# Patient Record
Sex: Male | Born: 1990 | Race: Black or African American | Hispanic: No | Marital: Single | Smoking: Never smoker
Health system: Southern US, Community
[De-identification: ages and names within clinical notes are randomized; demographics above are authoritative.]

## PROBLEM LIST (undated history)

## (undated) DIAGNOSIS — Z789 Other specified health status: Secondary | ICD-10-CM

---

## 2019-11-15 ENCOUNTER — Emergency Department (HOSPITAL_COMMUNITY)
Admission: EM | Admit: 2019-11-15 | Discharge: 2019-11-15 | Disposition: A | Discharge status: Home / Self Care | Payer: 59 | Attending: Emergency Medicine | Admitting: Emergency Medicine

## 2019-11-15 ENCOUNTER — Emergency Department (HOSPITAL_COMMUNITY): Payer: 59

## 2019-11-15 ENCOUNTER — Other Ambulatory Visit: Payer: Self-pay

## 2019-11-15 ENCOUNTER — Encounter (HOSPITAL_COMMUNITY): Payer: Self-pay

## 2019-11-15 DIAGNOSIS — Y939 Activity, unspecified: Secondary | ICD-10-CM | POA: Diagnosis not present

## 2019-11-15 DIAGNOSIS — Y929 Unspecified place or not applicable: Secondary | ICD-10-CM | POA: Diagnosis not present

## 2019-11-15 DIAGNOSIS — Y999 Unspecified external cause status: Secondary | ICD-10-CM | POA: Diagnosis not present

## 2019-11-15 DIAGNOSIS — S60512A Abrasion of left hand, initial encounter: Secondary | ICD-10-CM | POA: Diagnosis not present

## 2019-11-15 DIAGNOSIS — R0789 Other chest pain: Secondary | ICD-10-CM | POA: Diagnosis present

## 2019-11-15 HISTORY — DX: Other specified health status: Z78.9

## 2019-11-15 MED ORDER — IBUPROFEN 800 MG PO TABS: 800.0000 mg | ORAL_TABLET | Freq: Three times a day (TID) | ORAL | 0 refills | Status: AC

## 2019-11-15 MED ORDER — CYCLOBENZAPRINE HCL 10 MG PO TABS: 10.0000 mg | ORAL_TABLET | Freq: Two times a day (BID) | ORAL | 0 refills | Status: AC | PRN
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
## 2024-01-22 ENCOUNTER — Emergency Department (HOSPITAL_COMMUNITY)

## 2024-01-22 ENCOUNTER — Other Ambulatory Visit: Payer: Self-pay

## 2024-01-22 ENCOUNTER — Encounter (HOSPITAL_COMMUNITY): Payer: Self-pay

## 2024-01-22 ENCOUNTER — Emergency Department (HOSPITAL_COMMUNITY)
Admission: EM | Admit: 2024-01-22 | Discharge: 2024-01-22 | Disposition: A | Discharge status: Home / Self Care | Attending: Emergency Medicine | Admitting: Emergency Medicine

## 2024-01-22 DIAGNOSIS — M25511 Pain in right shoulder: Secondary | ICD-10-CM | POA: Diagnosis present

## 2024-01-22 DIAGNOSIS — R0789 Other chest pain: Secondary | ICD-10-CM | POA: Diagnosis not present

## 2024-01-22 LAB — CBC WITH DIFFERENTIAL/PLATELET
Abs Immature Granulocytes: 0.03 10*3/uL (ref 0.00–0.07)
Basophils Absolute: 0.1 10*3/uL (ref 0.0–0.1)
Basophils Relative: 1 %
Eosinophils Absolute: 0 10*3/uL (ref 0.0–0.5)
Eosinophils Relative: 0 %
HCT: 43.2 % (ref 39.0–52.0)
Hemoglobin: 14.3 g/dL (ref 13.0–17.0)
Immature Granulocytes: 1 %
Lymphocytes Relative: 53 %
Lymphs Abs: 3.2 10*3/uL (ref 0.7–4.0)
MCH: 27.8 pg (ref 26.0–34.0)
MCHC: 33.1 g/dL (ref 30.0–36.0)
MCV: 83.9 fL (ref 80.0–100.0)
Monocytes Absolute: 0.7 10*3/uL (ref 0.1–1.0)
Monocytes Relative: 12 %
Neutro Abs: 1.9 10*3/uL (ref 1.7–7.7)
Neutrophils Relative %: 33 %
Platelets: 268 10*3/uL (ref 150–400)
RBC: 5.15 MIL/uL (ref 4.22–5.81)
RDW: 11.9 % (ref 11.5–15.5)
WBC: 5.8 10*3/uL (ref 4.0–10.5)
nRBC: 0 % (ref 0.0–0.2)

## 2024-01-22 LAB — COMPREHENSIVE METABOLIC PANEL WITH GFR
ALT: 52 U/L — ABNORMAL HIGH (ref 0–44)
AST: 33 U/L (ref 15–41)
Albumin: 4.1 g/dL (ref 3.5–5.0)
Alkaline Phosphatase: 67 U/L (ref 38–126)
Anion gap: 8 (ref 5–15)
BUN: 10 mg/dL (ref 6–20)
CO2: 24 mmol/L (ref 22–32)
Calcium: 9.1 mg/dL (ref 8.9–10.3)
Chloride: 103 mmol/L (ref 98–111)
Creatinine, Ser: 0.92 mg/dL (ref 0.61–1.24)
GFR, Estimated: >60 mL/min (ref >60)
Glucose, Bld: 105 mg/dL — ABNORMAL HIGH (ref 70–99)
Potassium: 3.9 mmol/L (ref 3.5–5.1)
Sodium: 135 mmol/L (ref 135–145)
Total Bilirubin: 1 mg/dL (ref 0.0–1.2)
Total Protein: 8.5 g/dL — ABNORMAL HIGH (ref 6.5–8.1)

## 2024-01-22 LAB — TROPONIN I (HIGH SENSITIVITY): Troponin I (High Sensitivity): 6 ng/L (ref <18)

## 2024-01-22 NOTE — ED Provider Notes (Signed)
  EMERGENCY DEPARTMENT AT [facility] Provider Note  CSN: [id number] Arrival date & time: 01/22/24 0236  Chief Complaint(s) Shoulder Pain and Chest Pain  HPI Daniel Graham is a 33 y.o. male here for right shoulder pain felt while asleep.  Described as achiness.  It radiated across to his chest.  Pain lasted for 4 hours before resolving after taking Excedrin and sitting in the waiting room.  He denied any associated shortness of breath, nausea, vomiting, abdominal pain.  Pain was worse with movement and range of motion.  Denied any trauma or heavy lifting.  The history is provided by the patient.    Past Medical History Past Medical History:  Diagnosis Date   Medical history non-contributory    There are no active problems to display for this patient.  Home Medication(s) Prior to Admission medications   Medication Sig Start Date End Date Taking? Authorizing Provider  cyclobenzaprine  (FLEXERIL ) 10 MG tablet Take 1 tablet (10 mg total) by mouth 2 (two) times daily as needed for muscle spasms. 11/15/19   Jhonnie Mosher, PA-C  ibuprofen  (ADVIL ) 800 MG tablet Take 1 tablet (800 mg total) by mouth 3 (three) times daily. 11/15/19   Jhonnie Mosher, PA-C                                                                                                                                    Allergies Penicillins  Review of Systems Review of Systems As noted in HPI  Physical Exam Vital Signs  I have reviewed the triage vital signs BP (!) 161/105 (BP Location: Right Arm)   Pulse 73   Temp 98.8 F (37.1 C) (Oral)   Resp 16   Ht 6' (1.829 m)   Wt 125 kg   SpO2 100%   BMI 37.37 kg/m   Physical Exam Vitals reviewed.  Constitutional:      General: He is not in acute distress.    Appearance: He is well-developed. He is not diaphoretic.  HENT:     Head: Normocephalic and atraumatic.     Nose: Nose normal.  Eyes:     General: No scleral icterus.       Right eye: No  discharge.        Left eye: No discharge.     Conjunctiva/sclera: Conjunctivae normal.     Pupils: Pupils are equal, round, and reactive to light.  Cardiovascular:     Rate and Rhythm: Normal rate and regular rhythm.     Heart sounds: No murmur heard.    No friction rub. No gallop.  Pulmonary:     Effort: Pulmonary effort is normal. No respiratory distress.     Breath sounds: Normal breath sounds. No stridor. No rales.  Chest:     Chest wall: No tenderness.  Abdominal:     General: There is no distension.     Palpations: Abdomen is soft.  Tenderness: There is no abdominal tenderness.  Musculoskeletal:        General: No tenderness.     Cervical back: Normal range of motion and neck supple. No tenderness or bony tenderness.     Thoracic back: No spasms or bony tenderness.  Skin:    General: Skin is warm and dry.     Findings: No erythema or rash.  Neurological:     Mental Status: He is alert and oriented to person, place, and time.     ED Results and Treatments Labs (all labs ordered are listed, but only abnormal results are displayed) Labs Reviewed  COMPREHENSIVE METABOLIC PANEL WITH GFR - Abnormal; Notable for the following components:      Result Value   Glucose, Bld 105 (*)    Total Protein 8.5 (*)    ALT 52 (*)    All other components within normal limits  CBC WITH DIFFERENTIAL/PLATELET  TROPONIN I (HIGH SENSITIVITY)                                                                                                                         EKG  EKG Interpretation Date/Time:  Saturday January 22 2024 02:49:09 EDT Ventricular Rate:  76 PR Interval:  162 QRS Duration:  91 QT Interval:  386 QTC Calculation: 434 R Axis:   78  Text Interpretation: Sinus rhythm ST elev, probable normal early repol pattern No old tracing to compare Confirmed by Townsend Freud [phone]) on 01/22/2024 3:42:32 AM       Radiology DG Chest 2 View Result Date: 01/22/2024 CLINICAL DATA:   33 year old male with chest pain radiating to the left shoulder. EXAM: CHEST - 2 VIEW COMPARISON:  Chest radiographs 11/15/2019. FINDINGS: PA and lateral views 0405 hours today. Lung volumes and mediastinal contours remain normal. Visualized tracheal air column is within normal limits. No pneumothorax, pulmonary edema, pleural effusion or confluent lung opacity. Lung markings appear stable since 2021, within normal limits. No acute osseous abnormality identified. Negative visible bowel gas. IMPRESSION: No acute cardiopulmonary abnormality. Electronically Signed   By: Marlise Simpers M.D.   On: 01/22/2024 04:57    Medications Ordered in ED Medications - No data to display Procedures Procedures  (including critical care time) Medical Decision Making / ED Course   Medical Decision Making Amount and/or Complexity of Data Reviewed Labs: ordered. Decision-making details documented in ED Course. Radiology: ordered and independent interpretation performed. Decision-making details documented in ED Course. ECG/medicine tests: ordered and independent interpretation performed. Decision-making details documented in ED Course.    Right shoulder pain. Differential diagnosis considered Presentation favored to be muscular in nature. Atypical for ACS.  EKG without acute ischemic changes or evidence of pericarditis.  Troponin more than 6 hours after pain onset is negative. Presentation not classic for aortic dissection or esophageal perforation. Doubt pulmonary embolism Chest x-ray without evidence of pneumonia, pneumothorax, pulmonary edema pleural effusions.  Patient is noted to have elevated blood pressure of 170s over 100s.  Etiology unknown at this time but patient had recent PCP clinic visit in March with blood pressures in the 120s.  No evidence of endorgan damage at this time thus will not start patient on any antihypertensives.  Recommend lifestyle changes and close follow-up with PCP    Final Clinical  Impression(s) / ED Diagnoses Final diagnoses:  Acute pain of right shoulder  Chest wall pain   The patient appears reasonably screened and/or stabilized for discharge and I doubt any other medical condition or other [facility] requiring further screening, evaluation, or treatment in the ED at this time. I have discussed the findings, Dx and Tx plan with the patient/family who expressed understanding and agree(s) with the plan. Discharge instructions discussed at length. The patient/family was given strict return precautions who verbalized understanding of the instructions. No further questions at time of discharge.  Disposition: Discharge  Condition: Good  ED Discharge Orders     None         Follow Up: Primary care provider  Call  to schedule an appointment for close follow up    This chart was dictated using voice recognition software.  Despite best efforts to proofread,  errors can occur which can change the documentation meaning.    Lindle Rhea, MD 01/22/24 [phone]

## 2024-01-22 NOTE — ED Triage Notes (Signed)
 Pt. Arrives pov for r. Shoulder pain that radiates to the chest. States that the pain woke him up out of his sleep. He took Excedrin which relieved his pain, but he still wants to get it checked out.
